# Patient Record
Sex: Male | Born: 1980 | Race: Black or African American | Hispanic: No | Marital: Single | Smoking: Current every day smoker
Health system: Southern US, Community
[De-identification: ages and names within clinical notes are randomized; demographics above are authoritative.]

---

## 2008-03-13 ENCOUNTER — Emergency Department (HOSPITAL_COMMUNITY): Admission: EM | Admit: 2008-03-13 | Discharge: 2008-03-13 | Payer: Self-pay | Admitting: Emergency Medicine

## 2009-09-06 ENCOUNTER — Encounter: Payer: Self-pay | Admitting: Internal Medicine

## 2009-10-14 ENCOUNTER — Emergency Department (HOSPITAL_COMMUNITY): Admission: EM | Admit: 2009-10-14 | Discharge: 2009-10-14 | Payer: Self-pay | Admitting: Emergency Medicine

## 2009-10-24 ENCOUNTER — Emergency Department (HOSPITAL_COMMUNITY): Admission: EM | Admit: 2009-10-24 | Discharge: 2009-10-25 | Payer: Self-pay | Admitting: Emergency Medicine

## 2010-01-17 ENCOUNTER — Emergency Department (HOSPITAL_COMMUNITY): Admission: EM | Admit: 2010-01-17 | Discharge: 2010-01-17 | Payer: Self-pay | Admitting: Emergency Medicine

## 2010-07-05 NOTE — Miscellaneous (Signed)
Summary: Appointment Canceled  Appointment status changed to canceled by [employer] on 09/06/2009 11:11 AM.  Cancellation Comments --------------------- SINUS INFECTION [MKJ]  Appointment Information ----------------------- Appt Type:  ID OFFICE VISIT      Date:  Monday, September 06, 2009      Time:  2:30 PM for 15 min   Urgency:  Routine   Made By:  Hoy Finlay Scheduler  To Visit:  Glenn Clark    Reason:  SINUS INFECTION [MKJ]  Appt Comments ------------- -- 09/06/09 11:11: ([facility]) CANCELED -- SINUS INFECTION [MKJ] -- 09/06/09 10:59: ([facility]) BOOKED -- Routine ID OFFICE VISIT at 09/06/2009 2:30 PM for 15 min SINUS INFECTION [MKJ]

## 2010-08-19 LAB — URINE MICROSCOPIC-ADD ON

## 2010-08-19 LAB — POCT I-STAT, CHEM 8
BUN: 15 mg/dL (ref 6–23)
Creatinine, Ser: 1.4 mg/dL (ref 0.4–1.5)
Glucose, Bld: 132 mg/dL — ABNORMAL HIGH (ref 70–99)
Hemoglobin: 17.7 g/dL — ABNORMAL HIGH (ref 13.0–17.0)
Potassium: 3.5 mEq/L (ref 3.5–5.1)
TCO2: 28 mmol/L (ref 0–100)

## 2010-08-19 LAB — URINALYSIS, ROUTINE W REFLEX MICROSCOPIC
Bilirubin Urine: NEGATIVE
Glucose, UA: NEGATIVE mg/dL
Ketones, ur: NEGATIVE mg/dL
Leukocytes, UA: NEGATIVE
Specific Gravity, Urine: >1.046 — ABNORMAL HIGH (ref 1.005–1.030)
pH: 6.5 (ref 5.0–8.0)

## 2010-08-19 LAB — CBC
MCV: 94.7 fL (ref 78.0–100.0)
Platelets: 198 10*3/uL (ref 150–400)
RBC: 4.85 MIL/uL (ref 4.22–5.81)
WBC: 14.3 10*3/uL — ABNORMAL HIGH (ref 4.0–10.5)

## 2010-08-19 LAB — DIFFERENTIAL
Basophils Absolute: 0 10*3/uL (ref 0.0–0.1)
Eosinophils Absolute: 0.1 10*3/uL (ref 0.0–0.7)
Eosinophils Relative: 1 % (ref 0–5)

## 2010-08-22 LAB — DIFFERENTIAL
Basophils Absolute: 0.2 10*3/uL — ABNORMAL HIGH (ref 0.0–0.1)
Basophils Relative: 1 % (ref 0–1)
Lymphocytes Relative: 11 % — ABNORMAL LOW (ref 12–46)
Lymphs Abs: 2.3 10*3/uL (ref 0.7–4.0)
Monocytes Relative: 8 % (ref 3–12)
Neutro Abs: 17 10*3/uL — ABNORMAL HIGH (ref 1.7–7.7)

## 2010-08-22 LAB — CBC
HCT: 37.1 % — ABNORMAL LOW (ref 39.0–52.0)
Hemoglobin: 12.8 g/dL — ABNORMAL LOW (ref 13.0–17.0)
MCHC: 34.6 g/dL (ref 30.0–36.0)
Platelets: 416 10*3/uL — ABNORMAL HIGH (ref 150–400)
RDW: 11.7 % (ref 11.5–15.5)

## 2010-08-22 LAB — HEPATIC FUNCTION PANEL
ALT: 13 U/L (ref 0–53)
Alkaline Phosphatase: 36 U/L — ABNORMAL LOW (ref 39–117)
Total Bilirubin: 0.3 mg/dL (ref 0.3–1.2)

## 2010-08-22 LAB — BASIC METABOLIC PANEL
CO2: 26 mEq/L (ref 19–32)
GFR calc Af Amer: >60 mL/min (ref >60)
GFR calc non Af Amer: >60 mL/min (ref >60)
Glucose, Bld: 100 mg/dL — ABNORMAL HIGH (ref 70–99)

## 2010-08-22 LAB — LIPASE, BLOOD: Lipase: 17 U/L (ref 11–59)

## 2012-12-25 ENCOUNTER — Encounter (HOSPITAL_COMMUNITY): Payer: Self-pay | Admitting: *Deleted

## 2012-12-25 ENCOUNTER — Emergency Department (HOSPITAL_COMMUNITY)
Admission: EM | Admit: 2012-12-25 | Discharge: 2012-12-25 | Disposition: A | Discharge status: Home / Self Care | Payer: Self-pay | Attending: Emergency Medicine | Admitting: Emergency Medicine

## 2012-12-25 DIAGNOSIS — K625 Hemorrhage of anus and rectum: Secondary | ICD-10-CM | POA: Insufficient documentation

## 2012-12-25 DIAGNOSIS — K5900 Constipation, unspecified: Secondary | ICD-10-CM | POA: Insufficient documentation

## 2012-12-25 DIAGNOSIS — F17200 Nicotine dependence, unspecified, uncomplicated: Secondary | ICD-10-CM | POA: Insufficient documentation

## 2012-12-25 DIAGNOSIS — K644 Residual hemorrhoidal skin tags: Secondary | ICD-10-CM | POA: Insufficient documentation

## 2012-12-25 MED ORDER — POLYETHYLENE GLYCOL 3350 17 GM/SCOOP PO POWD: 17.0000 g | Freq: Two times a day (BID) | ORAL | Status: DC

## 2012-12-25 MED ORDER — HYDROCORTISONE ACETATE 25 MG RE SUPP: 25.0000 mg | Freq: Two times a day (BID) | RECTAL | Status: DC

## 2012-12-25 MED ORDER — HYDROCORTISONE 2.5 % RE CREA: TOPICAL_CREAM | RECTAL | Status: DC

## 2012-12-25 NOTE — ED Provider Notes (Signed)
History    CSN: [id number] Arrival date & time 12/25/12  1911  First MD Initiated Contact with Patient 12/25/12 1940     Chief Complaint  Patient presents with  . Hemorrhoids   (Consider location/radiation/quality/duration/timing/severity/associated sxs/prior Treatment) The history is provided by the patient and medical records. No language interpreter was used.    Glenn Clark is a 32 y.o. male  with a hx of hemorrhoids presents to the Emergency Department complaining of intermittent,  progressively worsening hemorrhoids beginning 1 month ago.  Pt states he is not constipated, and has several BMs per day. Pt states pain is worse after he has completed his BM.  Glenn Clark Associated symptoms include pain with defecation, occasional drops of blood on the tissue when wiping.  Tucks makes it better temporarily and defecation makes it worse.  Pt denies fever, chills, headache, abd pain, nausea, vomiting, diarrhea.   '  History reviewed. No pertinent past medical history. History reviewed. No pertinent past surgical history. History reviewed. No pertinent family history. History  Substance Use Topics  . Smoking status: Current Every Day Smoker    Types: Cigarettes  . Smokeless tobacco: Never Used  . Alcohol Use: Yes     Comment: on occasion    Review of Systems  Constitutional: Negative for fever, diaphoresis, appetite change, fatigue and unexpected weight change.  HENT: Negative for mouth sores, neck pain and neck stiffness.   Eyes: Negative for visual disturbance.  Respiratory: Negative for cough, chest tightness, shortness of breath and wheezing.   Cardiovascular: Negative for chest pain.  Gastrointestinal: Positive for constipation and anal bleeding. Negative for nausea, vomiting, abdominal pain and diarrhea.  Endocrine: Negative for polydipsia, polyphagia and polyuria.  Genitourinary: Negative for dysuria, urgency, frequency and hematuria.  Musculoskeletal: Negative for back pain.   Skin: Negative for rash.  Allergic/Immunologic: Negative for immunocompromised state.  Neurological: Negative for syncope, light-headedness and headaches.  Hematological: Does not bruise/bleed easily.  Psychiatric/Behavioral: Negative for sleep disturbance. The patient is not nervous/anxious.     Allergies  Review of patient's allergies indicates no known allergies.  Home Medications   Current Outpatient Rx  Name  Route  Sig  Dispense  Refill  . phenylephrine-shark liver oil-mineral oil-petrolatum (PREPARATION H) 0.25-3-14-71.9 % rectal ointment   Rectal   Place 1 application rectally 2 (two) times daily as needed for hemorrhoids.         . hydrocortisone (ANUSOL-HC) 2.5 % rectal cream      Apply rectally 2 times daily   30 g   0   . hydrocortisone (ANUSOL-HC) 25 MG suppository   Rectal   Place 1 suppository (25 mg total) rectally 2 (two) times daily. For 7 days   14 suppository   0   . polyethylene glycol powder (GLYCOLAX/MIRALAX) powder   Oral   Take 17 g by mouth 2 (two) times daily. Until daily soft stools  OTC   255 g   0    BP 127/61  Pulse 66  Temp(Src) 99.1 F (37.3 C) (Oral)  Resp 17  Ht 5\' 11"  (1.803 m)  Wt 150 lb (68.04 kg)  BMI 20.93 kg/m2  SpO2 99% Physical Exam  Nursing note and vitals reviewed. Constitutional: He is oriented to person, place, and time. He appears well-developed and well-nourished. No distress.  Awake, alert, nontoxic appearance  HENT:  Head: Normocephalic and atraumatic.  Mouth/Throat: Oropharynx is clear and moist. No oropharyngeal exudate.  Eyes: Conjunctivae are normal. No scleral icterus.  Neck: Normal range of motion. Neck supple.  Cardiovascular: Normal rate, regular rhythm, normal heart sounds and intact distal pulses.   No murmur heard. Pulmonary/Chest: Effort normal and breath sounds normal. No respiratory distress. He has no wheezes.  Abdominal: Soft. Bowel sounds are normal. He exhibits no mass. There is no  tenderness. There is no rebound and no guarding.  Genitourinary: Rectal exam shows external hemorrhoid and tenderness. Rectal exam shows no fissure and no mass.  Pt with large, nonthrombosed external hemorrhoids  Musculoskeletal: Normal range of motion. He exhibits no edema.  Neurological: He is alert and oriented to person, place, and time. He exhibits normal muscle tone. Coordination normal.  Speech is clear and goal oriented Moves extremities without ataxia  Skin: Skin is warm and dry. He is not diaphoretic. No erythema.  Psychiatric: He has a normal mood and affect.    ED Course  Procedures (including critical care time) Labs Reviewed - No data to display No results found. 1. External hemorrhoids     MDM  Glenn Clark presents with external, nonthrombosed hemorrhoids.  Will not I&D at this time. Discussed increased water intake, increased fiber intake and use of MiraLax.  Also discussed use of Anusol cream.  No anal fissure, no induration or evidence of cellulitis.  No abscess on exam.  Encouraged f/u with general surgery.  I have also discussed reasons to return immediately to the ER.  Patient expresses understanding and agrees with plan.     Glenn Client Ailyne Pawley, PA-C 12/25/12 2017

## 2012-12-25 NOTE — ED Provider Notes (Signed)
Medical screening examination/treatment/procedure(s) were performed by non-physician practitioner and as supervising physician I was immediately available for consultation/collaboration.    Pristine Gladhill J. Laurance Heide, MD 12/25/12 2329 

## 2012-12-25 NOTE — ED Notes (Signed)
Pt states he has had hemmrhoids for the last 2 months. Pt states that usually he will get otc medication to help with problem but as of late has not help relieve symptoms. Pt states that recently he has had more difficutly.

## 2013-07-11 ENCOUNTER — Encounter (HOSPITAL_COMMUNITY): Payer: Self-pay | Admitting: Emergency Medicine

## 2013-07-11 ENCOUNTER — Emergency Department (HOSPITAL_COMMUNITY)
Admission: EM | Admit: 2013-07-11 | Discharge: 2013-07-11 | Disposition: A | Discharge status: Home / Self Care | Payer: Managed Care, Other (non HMO) | Attending: Emergency Medicine | Admitting: Emergency Medicine

## 2013-07-11 ENCOUNTER — Emergency Department (HOSPITAL_COMMUNITY): Payer: Managed Care, Other (non HMO)

## 2013-07-11 DIAGNOSIS — F17200 Nicotine dependence, unspecified, uncomplicated: Secondary | ICD-10-CM | POA: Insufficient documentation

## 2013-07-11 DIAGNOSIS — N200 Calculus of kidney: Secondary | ICD-10-CM | POA: Insufficient documentation

## 2013-07-11 DIAGNOSIS — IMO0002 Reserved for concepts with insufficient information to code with codable children: Secondary | ICD-10-CM | POA: Insufficient documentation

## 2013-07-11 LAB — URINALYSIS, ROUTINE W REFLEX MICROSCOPIC
GLUCOSE, UA: NEGATIVE mg/dL
Ketones, ur: 40 mg/dL — AB
Nitrite: NEGATIVE
PH: 6 (ref 5.0–8.0)
Protein, ur: 30 mg/dL — AB
SPECIFIC GRAVITY, URINE: 1.025 (ref 1.005–1.030)
Urobilinogen, UA: 0.2 mg/dL (ref 0.0–1.0)

## 2013-07-11 LAB — URINE MICROSCOPIC-ADD ON

## 2013-07-11 MED ORDER — SODIUM CHLORIDE 0.9 % IV BOLUS (SEPSIS)
1000.0000 mL | Freq: Once | INTRAVENOUS | Status: AC
  Administered 2013-07-11: 1000 mL via INTRAVENOUS

## 2013-07-11 MED ORDER — KETOROLAC TROMETHAMINE 30 MG/ML IJ SOLN
30.0000 mg | Freq: Once | INTRAMUSCULAR | Status: AC
  Administered 2013-07-11: 30 mg via INTRAVENOUS
  Filled 2013-07-11: qty 1

## 2013-07-11 MED ORDER — ONDANSETRON HCL 4 MG/2ML IJ SOLN
4.0000 mg | Freq: Once | INTRAMUSCULAR | Status: AC
  Administered 2013-07-11: 4 mg via INTRAVENOUS
  Filled 2013-07-11: qty 2

## 2013-07-11 MED ORDER — OXYCODONE-ACETAMINOPHEN 5-325 MG PO TABS: 2.0000 | ORAL_TABLET | ORAL | Status: AC | PRN

## 2013-07-11 MED ORDER — MORPHINE SULFATE 4 MG/ML IJ SOLN
4.0000 mg | Freq: Once | INTRAMUSCULAR | Status: AC
  Administered 2013-07-11: 4 mg via INTRAVENOUS
  Filled 2013-07-11: qty 1

## 2013-07-11 NOTE — Progress Notes (Signed)
Pt confirms no pcp and has [employer]

## 2013-07-11 NOTE — ED Notes (Signed)
Pt c/o RLQ pain that started this morning when he woke up. Pt denies lifting or injuring himself. Pt states he has been little nauseated with the pain this morning and has hemorrhoid and small BMs.

## 2013-07-11 NOTE — Progress Notes (Signed)
   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 c/o  right lower corner pain microscopic hematuria along with a 3 mm renal calculus at the right distal ureter d/c with recommendation to f/u with urology     Subjective/Objective Assessment Detail:     Action/Plan:   Action/Plan Detail:   Glenn ShoulderKimberly Louise Matalynn Graff, RN Case Manager Signed CASE MANAGEMENT Progress Notes Service date: 07/11/2013 10:51 AM  Pt confirms no pcp and has [employer]   Anticipated DC Date:  07/11/2013     Status Recommendation to Physician:   Result of Recommendation:    Other ED Services  Consult Working Plan    DC Planning Services  Other  PCP issues    Choice offered to / List presented to:            Status of service:  Completed, signed off  ED Comments:   ED Comments Detail:  [facility] ED CM spoke with pt on how to obtain an in network pcp with insurance coverage via the customer service number or web site Informed pt Cm verified [facility] are listed on [employer] find a doctor internet site Encouraged to make and go to his appointment Encouraged him to contact [employer] customer service number to obtain a list of pcps in his zip code Cm reviewed ED level of care for crisis/emergent services and community pcp level of care to manage continuous or chronic medical concerns.  The pt voiced understanding CM encouraged pt and discussed pt'Clark responsibility to verify with pt'Clark insurance carrier that any recommended medical provider offered by any emergency room or a hospital provider is within the carrier'Clark network. The pt voiced understanding

## 2013-07-11 NOTE — Discharge Instructions (Signed)
Percocet as needed for pain.  If you have not pass the stone by Monday, call [facility] to schedule a followup appointment. The contact information has been given and this discharge summary.  Return to the emergency department if you develop worsening pain not controlled with your medicines, high fever, or other new or concerning symptoms.   Kidney Stones Kidney stones (urolithiasis) are deposits that form inside your kidneys. The intense pain is caused by the stone moving through the urinary tract. When the stone moves, the ureter goes into spasm around the stone. The stone is usually passed in the urine.  CAUSES   A disorder that makes certain neck glands produce too much parathyroid hormone (primary hyperparathyroidism).  A buildup of uric acid crystals, similar to gout in your joints.  Narrowing (stricture) of the ureter.  A kidney obstruction present at birth (congenital obstruction).  Previous surgery on the kidney or ureters.  Numerous kidney infections. SYMPTOMS   Feeling sick to your stomach (nauseous).  Throwing up (vomiting).  Blood in the urine (hematuria).  Pain that usually spreads (radiates) to the groin.  Frequency or urgency of urination. DIAGNOSIS   Taking a history and physical exam.  Blood or urine tests.  CT scan.  Occasionally, an examination of the inside of the urinary bladder (cystoscopy) is performed. TREATMENT   Observation.  Increasing your fluid intake.  Extracorporeal shock wave lithotripsy This is a noninvasive procedure that uses shock waves to break up kidney stones.  Surgery may be needed if you have severe pain or persistent obstruction. There are various surgical procedures. Most of the procedures are performed with the use of small instruments. Only small incisions are needed to accommodate these instruments, so recovery time is minimized. The size, location, and chemical composition are all important variables that will  determine the proper choice of action for you. Talk to your health care provider to better understand your situation so that you will minimize the risk of injury to yourself and your kidney.  HOME CARE INSTRUCTIONS   Drink enough water and fluids to keep your urine clear or pale yellow. This will help you to pass the stone or stone fragments.  Strain all urine through the provided strainer. Keep all particulate matter and stones for your health care provider to see. The stone causing the pain may be as small as a grain of salt. It is very important to use the strainer each and every time you pass your urine. The collection of your stone will allow your health care provider to analyze it and verify that a stone has actually passed. The stone analysis will often identify what you can do to reduce the incidence of recurrences.  Only take over-the-counter or prescription medicines for pain, discomfort, or fever as directed by your health care provider.  Make a follow-up appointment with your health care provider as directed.  Get follow-up X-rays if required. The absence of pain does not always mean that the stone has passed. It may have only stopped moving. If the urine remains completely obstructed, it can cause loss of kidney function or even complete destruction of the kidney. It is your responsibility to make sure X-rays and follow-ups are completed. Ultrasounds of the kidney can show blockages and the status of the kidney. Ultrasounds are not associated with any radiation and can be performed easily in a matter of minutes. SEEK MEDICAL CARE IF:  You experience pain that is progressive and unresponsive to any pain medicine you  have been prescribed. SEEK IMMEDIATE MEDICAL CARE IF:   Pain cannot be controlled with the prescribed medicine.  You have a fever or shaking chills.  The severity or intensity of pain increases over 18 hours and is not relieved by pain medicine.  You develop a new onset  of abdominal pain.  You feel faint or pass out.  You are unable to urinate. MAKE SURE YOU:   Understand these instructions.  Will watch your condition.  Will get help right away if you are not doing well or get worse. Document Released: 05/22/2005 Document Revised: 01/22/2013 Document Reviewed: 10/23/2012 [facility] Patient Information 2014 [place], [place].

## 2013-07-11 NOTE — ED Provider Notes (Signed)
CSN: [id number]     Arrival date & time 07/11/13  [id number] History   First MD Initiated Contact with Patient 07/11/13 [phone]     Chief Complaint  Patient presents with  . Abdominal Pain   (Consider location/radiation/quality/duration/timing/severity/associated sxs/prior Treatment) HPI Comments: Patient is a 33 year old male with history of kidney stones. He presents today with complaints of pain in the right lower quadrant that started this morning and woke him from sleep. He went to bed last night feeling well. He feels nauseated and has vomited once. He denies any urinary complaints. He has had kidney stones in the past but this feels different.  Patient is a 33 y.o. male presenting with abdominal pain. The history is provided by the patient.  Abdominal Pain Pain location:  RLQ Pain quality: sharp   Pain radiates to:  Does not radiate Pain severity:  Severe Onset quality:  Sudden Duration:  4 hours Timing:  Constant Progression:  Worsening Chronicity:  New Context: awakening from sleep   Relieved by:  Nothing Worsened by:  Nothing tried Ineffective treatments:  None tried   History reviewed. No pertinent past medical history. History reviewed. No pertinent past surgical history. No family history on file. History  Substance Use Topics  . Smoking status: Current Every Day Smoker    Types: Cigarettes  . Smokeless tobacco: Never Used  . Alcohol Use: Yes     Comment: on occasion    Review of Systems  Gastrointestinal: Positive for abdominal pain.  All other systems reviewed and are negative.    Allergies  Review of patient's allergies indicates no known allergies.  Home Medications   Current Outpatient Rx  Name  Route  Sig  Dispense  Refill  . hydrocortisone (ANUSOL-HC) 2.5 % rectal cream      Apply rectally 2 times daily   30 g   0   . hydrocortisone (ANUSOL-HC) 25 MG suppository   Rectal   Place 1 suppository (25 mg total) rectally 2 (two) times daily. For 7  days   14 suppository   0   . phenylephrine-shark liver oil-mineral oil-petrolatum (PREPARATION H) 0.25-3-14-71.9 % rectal ointment   Rectal   Place 1 application rectally 2 (two) times daily as needed for hemorrhoids.         . polyethylene glycol powder (GLYCOLAX/MIRALAX) powder   Oral   Take 17 g by mouth 2 (two) times daily. Until daily soft stools  OTC   255 g   0    There were no vitals taken for this visit. Physical Exam  Nursing note and vitals reviewed. Constitutional: He is oriented to person, place, and time. He appears well-developed and well-nourished. No distress.  HENT:  Head: Normocephalic and atraumatic.  Neck: Normal range of motion. Neck supple.  Cardiovascular: Normal rate, regular rhythm and normal heart sounds.   No murmur heard. Pulmonary/Chest: Effort normal and breath sounds normal. No respiratory distress. He has no wheezes.  Abdominal: Soft. Bowel sounds are normal. He exhibits no distension. There is tenderness.  There is tenderness to palpation in the right lower quadrant. There is no rebound and no guarding.  Musculoskeletal: Normal range of motion. He exhibits no edema.  Neurological: He is alert and oriented to person, place, and time.  Skin: Skin is warm and dry. He is not diaphoretic.    ED Course  Procedures (including critical care time) Labs Review Labs Reviewed  CBC WITH DIFFERENTIAL  COMPREHENSIVE METABOLIC PANEL  URINALYSIS, ROUTINE W REFLEX MICROSCOPIC  Imaging Review No results found.    MDM  No diagnosis found. Patient is a 33 year old male who presents with complaints of right lower corner pain that started acutely this morning. Workup reveals microscopic hematuria along with a 3 mm renal calculus at the right distal ureter. He is feeling better with medications in the ER. He will be discharged with pain medicine and when necessary followup with urology if he has not passed the stone over the weekend.    Geoffery Lyons,  MD 07/11/13 [phone]

## 2013-07-11 NOTE — ED Notes (Signed)
Attempted blood draw with IV start. Blood very slow when pulling out of vein.  Didn't want to lose IV access.  After fluids, will re-attempt to collect blood specimens.

## 2015-08-21 IMAGING — CT CT ABD-PELV W/O CM
1 series · 12 of 32 positions shown, 15 images · non-contrast
Comparison: 01/17/2010.

CLINICAL DATA: Right-sided flank pain.  Kidney stone.

EXAM:
CT ABDOMEN AND PELVIS WITHOUT CONTRAST
TECHNIQUE: Multidetector CT imaging of the abdomen and pelvis was performed
following the standard protocol without intravenous contrast.

[Series 6: sagittal · sagittal · 0.63mm/px · 12 of 118 slices shown, 15 images]
[im 4/118  lung]
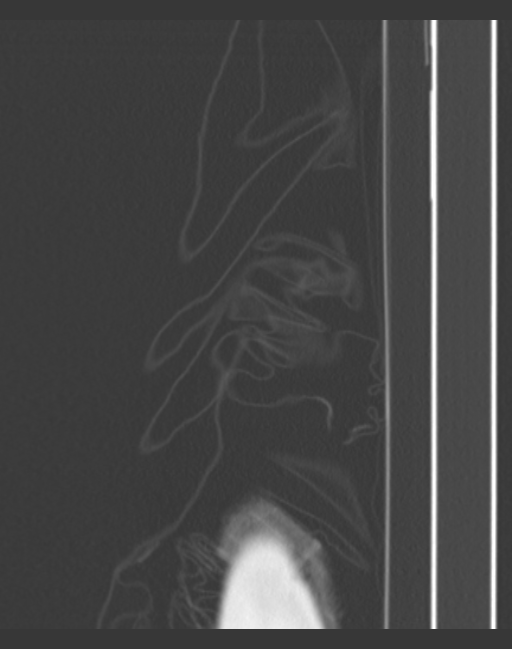
[im 8/118  soft-tissue]
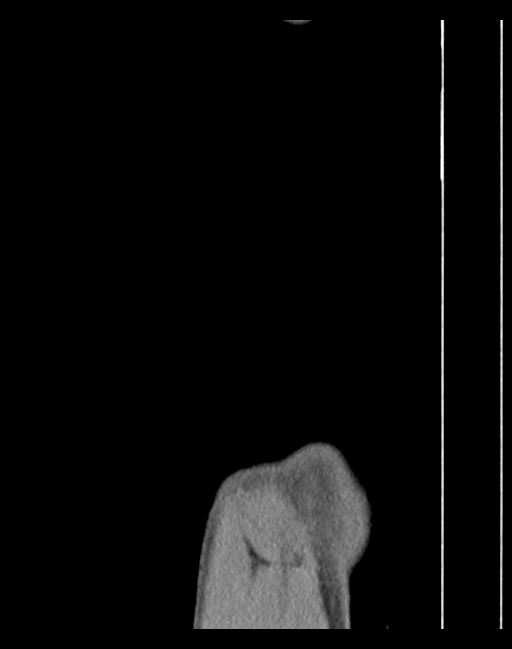
[im 8/118  lung]
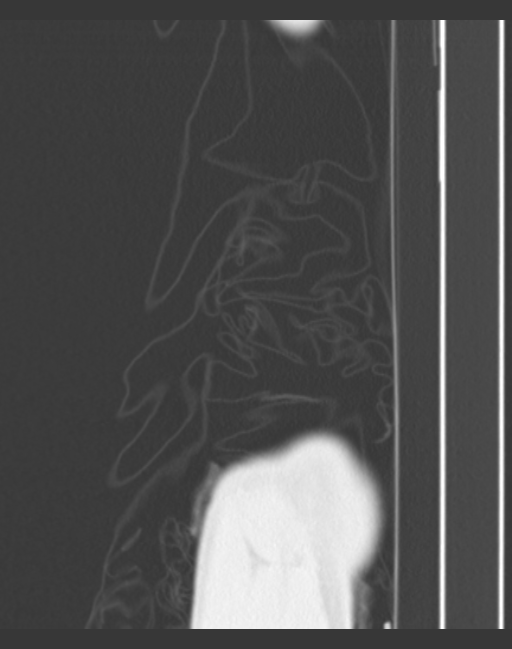
[im 8/118  bone]
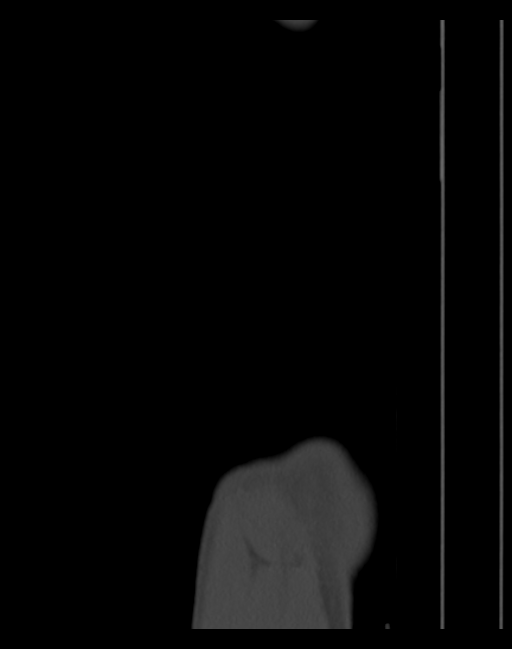
[im 12/118  lung]
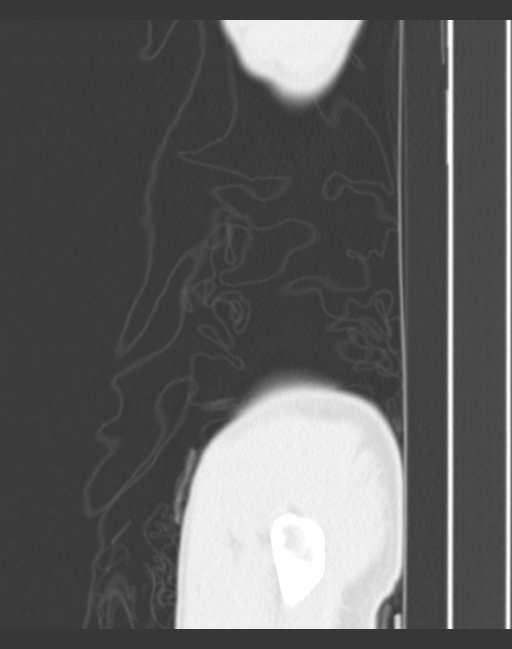
[im 16/118  lung]
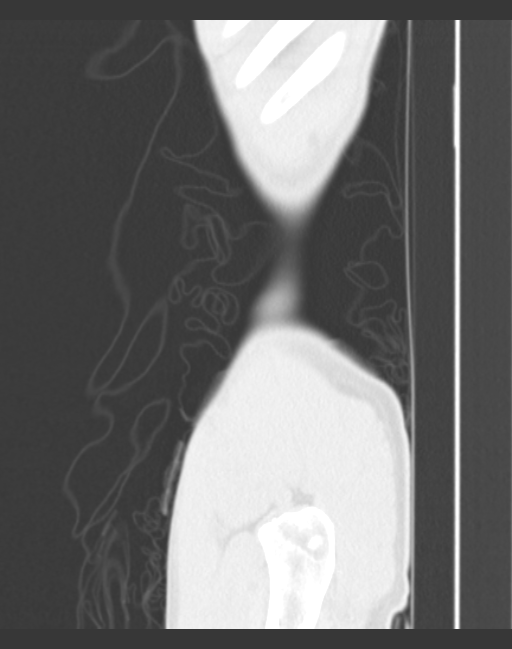
[im 23/118  soft-tissue]
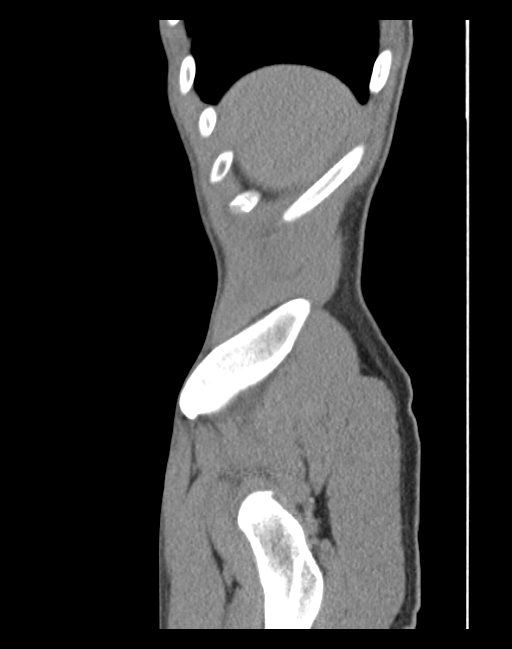
[im 34/118  soft-tissue]
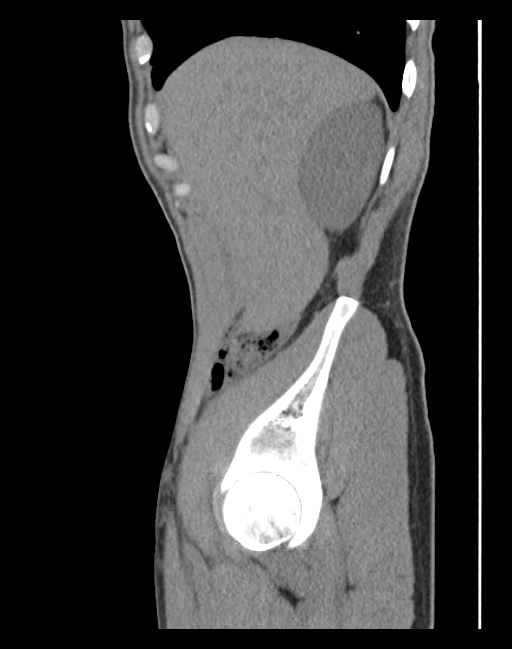
[im 46/118  soft-tissue]
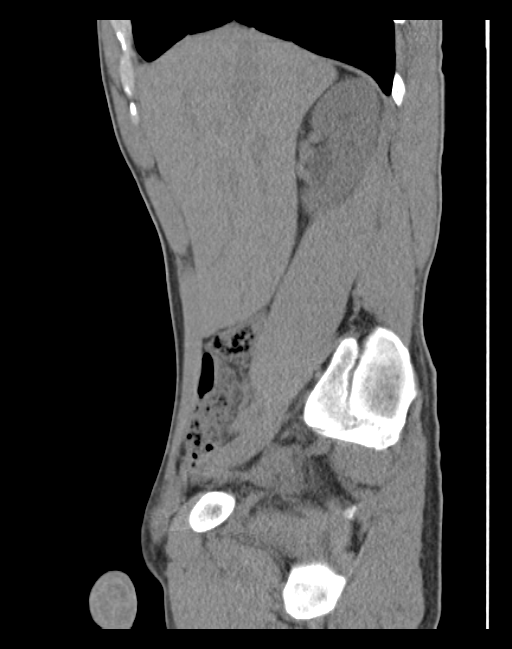
[im 61/118  soft-tissue]
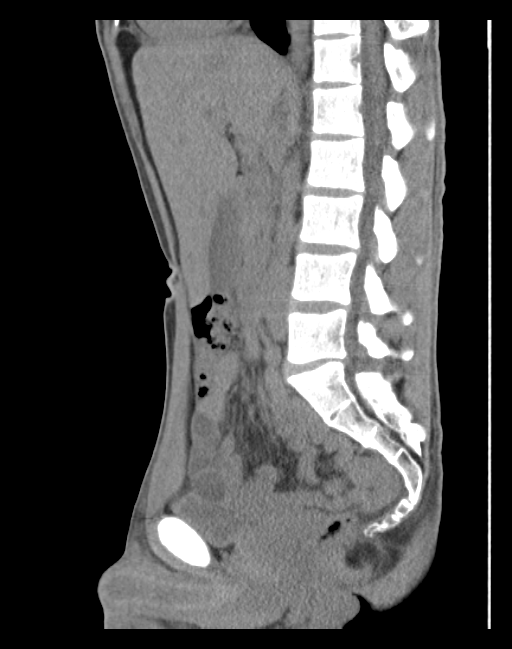
[im 72/118  soft-tissue]
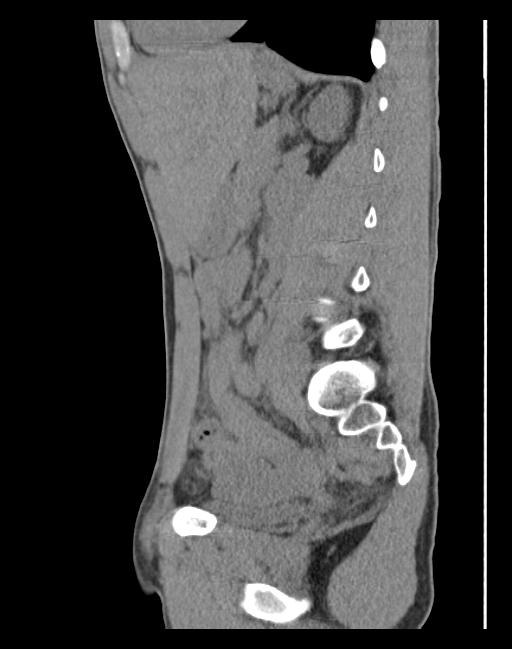
[im 84/118  soft-tissue]
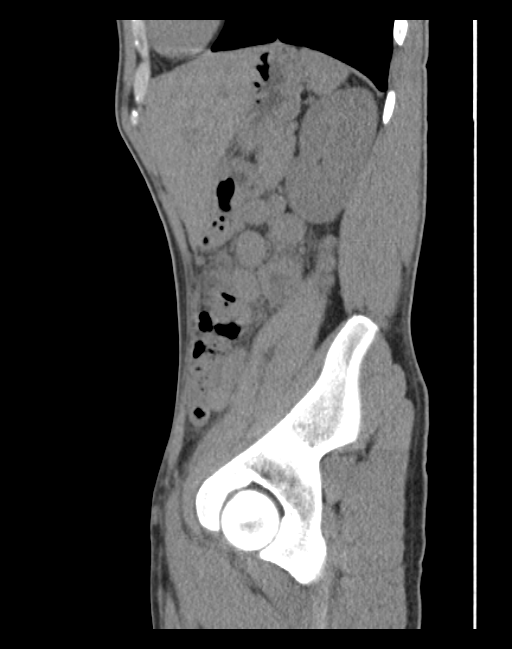
[im 99/118  soft-tissue]
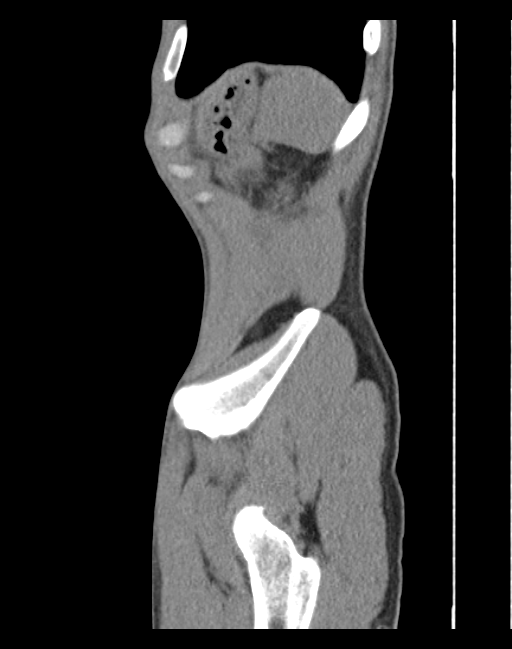
[im 110/118  soft-tissue]
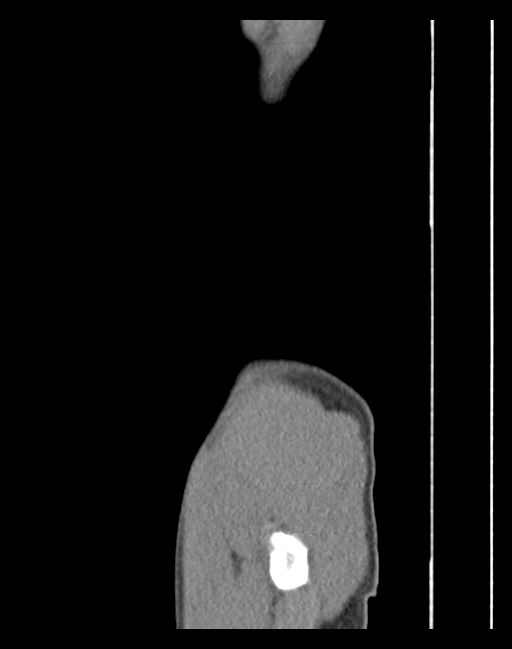
[im 110/118  bone]
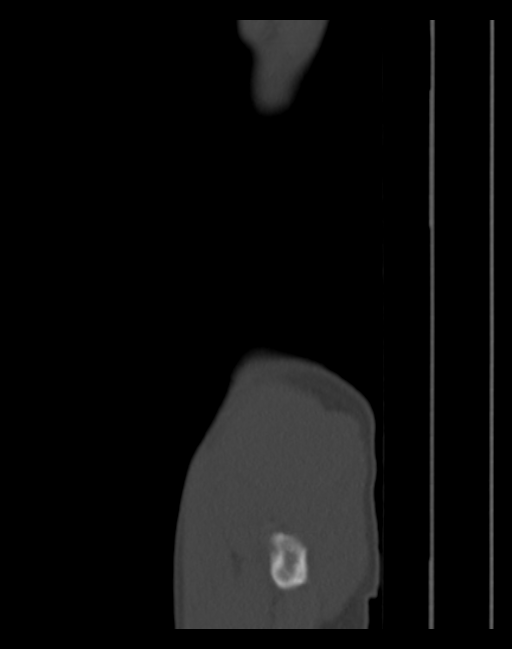

[12 of 32 positions shown; findings below may reference images not displayed]

FINDINGS: Lung Bases: Clear.

Liver: Unenhanced CT was performed per clinician order. Lack of IV
contrast limits sensitivity and specificity, especially for
evaluation of abdominal/pelvic solid viscera. Normal.

Spleen:  Normal.

Gallbladder:  Normal.

Common bile duct:  Normal.

Pancreas:  Normal.

Adrenal glands:  Normal bilaterally.

Kidneys: No left hydronephrosis. Left ureter appears normal.
Nonobstructing punctate left renal calculi are present. The right
ureter is abnormal, with Mild left hydroureteronephrosis. In the
distal left ureter, there is a 3 mm calculus A few cm distal to the
iliac cross over (image 59 series 2). Right renal cyst appears
unchanged compared to prior. On the coronal images, there is loss of
the normal hyperdense renal pyramids on the right.

Stomach:  Grossly normal.

Small bowel:  Grossly normal.

Colon:   Normal appendix.  Remainder grossly normal.

Pelvic Genitourinary:  Urinary bladder collapsed.

Bones: No aggressive osseous lesions. L5-S1 degenerative
degenerative disease.

Vasculature: Normal.

Body Wall: Normal.
IMPRESSION: Mild right hydroureteronephrosis with 3 mm distal right ureteral
calculus in the anatomic pelvis distal to the iliac cross over.
Nonobstructing left renal collecting system calculi.
